# Patient Record
Sex: Female | Born: 1995 | Race: White | Hispanic: No | Marital: Single | Smoking: Never smoker
Health system: Southern US, Community
[De-identification: ages and names within clinical notes are randomized; demographics above are authoritative.]

---
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
## 2017-09-13 ENCOUNTER — Encounter (HOSPITAL_COMMUNITY): Payer: Self-pay | Admitting: *Deleted

## 2017-09-13 ENCOUNTER — Emergency Department (HOSPITAL_COMMUNITY)
Admission: EM | Admit: 2017-09-13 | Discharge: 2017-09-13 | Disposition: A | Discharge status: Home / Self Care | Payer: BC Managed Care – PPO | Attending: Emergency Medicine | Admitting: Emergency Medicine

## 2017-09-13 ENCOUNTER — Emergency Department (HOSPITAL_COMMUNITY): Payer: BC Managed Care – PPO

## 2017-09-13 DIAGNOSIS — M545 Low back pain: Secondary | ICD-10-CM | POA: Diagnosis present

## 2017-09-13 LAB — POC URINE PREG, ED: Preg Test, Ur: NEGATIVE

## 2017-09-13 MED ORDER — KETOROLAC TROMETHAMINE 30 MG/ML IJ SOLN
30.0000 mg | Freq: Once | INTRAMUSCULAR | Status: AC
  Administered 2017-09-13: 30 mg via INTRAMUSCULAR
  Filled 2017-09-13: qty 1

## 2017-09-13 MED ORDER — HYDROCODONE-ACETAMINOPHEN 5-325 MG PO TABS
1.0000 | ORAL_TABLET | Freq: Once | ORAL | Status: AC
  Administered 2017-09-13: 1 via ORAL
  Filled 2017-09-13: qty 1

## 2017-09-13 NOTE — ED Notes (Signed)
Called MRI.  They will most likely be ready for pt at 1700.  Family notified.

## 2017-09-13 NOTE — ED Triage Notes (Signed)
Pt here via [facility] after sustaining back injury while kicking a soccer ball.  Pt began experiencing central back pain while kicking, pain increased when landing and with walking.  Initial numbness to L leg and R hip pain.

## 2017-09-13 NOTE — ED Notes (Signed)
Patient transported to CT 

## 2017-09-13 NOTE — ED Provider Notes (Signed)
Assumed care from [facility], at 1500.  Patient presented with lumbar pain, numbness, and tingling in bilateral lower extremities after a soccer injury today.  History significant for a similar injury approximately 2 months ago, at which time she had a lumbar x-ray that was normal.  Analgesics administered prior to my assumption of care: toradol and hydrocodone.  Plan at the time of handoff:  - Follow-up PVR - Follow-up MRI lumbar spine  5:47 PM At the time of my assessment, the patient was neurovascularly intact.  PVR negative and MRI negative.  The patient was able to ambulate without assistance throughout the hospital room and did not demonstrate any weakness or sensory deficits on my exam.  I discussed the above results with the patient who verbalized understanding.  Return precautions and follow-up plans discussed with the patient and her parents including the importance of establishing a PCP for follow-up.  The patient was discharged in stable condition and encouraged to treat her pain with Tylenol and/or Motrin as needed.    Levester Fresh, MD 09/14/17 1610    Blane Ohara, MD 09/14/17 [phone]

## 2017-09-13 NOTE — ED Notes (Signed)
Patient transported to MRI 

## 2017-09-13 NOTE — ED Provider Notes (Signed)
[facility]-EMERGENCY DEPT Provider Note   CSN: [id number] Arrival date & time: 09/13/17  1425     History   Chief Complaint Chief Complaint  Patient presents with  . Back Pain    HPI Kariah Loredo is a 21 y.o. female.  HPI 21 year old Caucasian female with no significant past medical history presents to the emergency department today with complaints of low back pain after playing soccer. The patient states that she was running today and went to kick the ball when she experienced acute onset sudden pain in her lower back. Patient states the pain radiated down both legs and dropped her to her knees. Patient tried to get up and walk to the pain was too severe and had a bee transported by EMS to the ED for evaluation. Patient reports some mild numbness to the left leg and right hip. Pt has same injury 2 months ago and prior injury. He was seen in the ER and [place] with x-rays that were normal. Patient states at that time she did have some loss of sensation in her lower extremities that self resolved. Patient has not had follow up. Patient denies any loss of bowel or bladder. Currently denies any parathesias. Denies any hx of iv drug use or cancer. Pt states pain is worse with movement. Holding still makes the pain better.patient not taking anything for the pain prior to arrival. Denies head injury or loc.   Pt denies any fever, chill, ha, vision changes, lightheadedness, dizziness, congestion, neck pain, cp, sob, cough, abd pain, n/v/d, urinary symptoms, change in bowel habits, melena, hematochezia.  History reviewed. No pertinent past medical history.  There are no active problems to display for this patient.   History reviewed. No pertinent surgical history.  OB History    No data available       Home Medications    Prior to Admission medications   Not on File    Family History No family history on file.  Social History Social History  Substance Use Topics  . Smoking  status: Never Smoker  . Smokeless tobacco: Never Used  . Alcohol use No     Allergies   Patient has no known allergies.   Review of Systems Review of Systems  Constitutional: Negative for chills and fever.  HENT: Negative for congestion.   Eyes: Negative for visual disturbance.  Respiratory: Negative for cough and shortness of breath.   Cardiovascular: Negative for chest pain.  Gastrointestinal: Negative for abdominal pain, diarrhea, nausea and vomiting.  Genitourinary: Negative for dysuria, flank pain, frequency, hematuria, urgency, vaginal bleeding and vaginal discharge.  Musculoskeletal: Positive for back pain. Negative for arthralgias and myalgias.  Skin: Negative for rash.  Neurological: Positive for numbness. Negative for dizziness, syncope, weakness, light-headedness and headaches.  Psychiatric/Behavioral: Negative for sleep disturbance. The patient is not nervous/anxious.      Physical Exam Updated Vital Signs Temp 98.7 F (37.1 C) (Oral)   Ht  (1.676 m)   Wt 59 kg (130 lb)   LMP 08/13/2017   BMI 20.98 kg/m   Physical Exam  Constitutional: She is oriented to person, place, and time. She appears well-developed and well-nourished.  Non-toxic appearance. No distress.  HENT:  Head: Normocephalic and atraumatic.  Nose: Nose normal.  Mouth/Throat: Oropharynx is clear and moist.  Eyes: Pupils are equal, round, and reactive to light. Conjunctivae are normal. Right eye exhibits no discharge. Left eye exhibits no discharge.  Neck: Normal range of motion. Neck supple.  Cardiovascular: Normal  rate, regular rhythm, normal heart sounds and intact distal pulses.   DP pulses are 2+ bilaterally.  Pulmonary/Chest: Effort normal and breath sounds normal. No respiratory distress. She exhibits no tenderness.  Abdominal: Soft. Bowel sounds are normal. There is no tenderness. There is no rebound and no guarding.  Musculoskeletal: Normal range of motion. She exhibits no  tenderness.  No midline T spine tenderness. No deformities or step offs noted. Full ROM. Pelvis is stable.   L spine tenderness midline. No obvious deformities or step offs.   Range of motion of the flexion and extension of bilateral knees, ankles, hips.  Lymphadenopathy:    She has no cervical adenopathy.  Neurological: She is alert and oriented to person, place, and time.  The patient is alert, attentive, and oriented x 3. Speech is clear. Cranial nerve II-VII grossly intact. Negative pronator drift. Sensation intact to sharp/dull. Strength 5/5 in all extremities. Reflexes 2+ and symmetric at biceps, triceps, knees, and ankles. Rapid alternating movement and fine finger movements intact. No clonus.  Negative straight leg raise test.    Skin: Skin is warm and dry. Capillary refill takes less than 2 seconds.  Psychiatric: Her behavior is normal. Judgment and thought content normal.  Nursing note and vitals reviewed.    ED Treatments / Results  Labs (all labs ordered are listed, but only abnormal results are displayed) Labs Reviewed  POC URINE PREG, ED    EKG  EKG Interpretation None       Radiology No results found.  Procedures Procedures (including critical care time)  Medications Ordered in ED Medications  HYDROcodone-acetaminophen (NORCO/VICODIN) 5-325 MG per tablet 1 tablet (not administered)     Initial Impression / Assessment and Plan / ED Course  I have reviewed the triage vital signs and the nursing notes.  Pertinent labs & imaging results that were available during my care of the patient were reviewed by me and considered in my medical decision making (see chart for details).     Patient presents to the ED with complaints of low back pain following an injury playing sports today. History of same. Patient reports some mild paresthesias initially that has since resolved. Has not taken anything for the pain prior to arrival. Has not been ambulatory since the  event due to the pain.  Vital signs are reassuring. On exam patient has no focal neuro deficits. Normal strength in  Extremities. Sensation intact. Neurovascularly intact. Patient has no loss of bowel or bladder. No urinary retention. Low suspicion for cauda equina. We will perform x-ray and give pain medicine. WE WILL REEVALUATE PATIENT"S PAIN AND ABILITY TO AMBULATE FOLLOWING PAIN MEDICINES AND X_RAY. This will determine if further imaging is indicated.  Care hand off to resident and Dr. Jodi Mourning for reevaluation after x-ray and pain medicine and further disposition. Patient remains hemodynamically stable th time.  Final Clinical Impressions(s) / ED Diagnoses   Final diagnoses:  None    New Prescriptions New Prescriptions   No medications on file     Wallace Keller 09/13/17 1523    Blane Ohara, MD 09/14/17 [phone]

## 2017-09-13 NOTE — Discharge Instructions (Signed)
Take Motrin and/or Tylenol as needed for pain and use ice/heat packs.  Do not play soccer until your symptoms have resolved.  Follow-up with your primary care physician within 3-5 days for additional evaluation if symptoms persist.

## 2017-09-13 NOTE — ED Notes (Signed)
Pt ambulated into the hallway and back to her bed with minimal assistance.  Pt tolerated well and denied any increase in weakness, dizziness, or pain.

## 2018-03-23 IMAGING — MR MR LUMBAR SPINE W/O CM
4 of 5 series · 19 of 48 positions shown · non-contrast
Comparison: None.

CLINICAL DATA: Initial evaluation for acute low back pain, numbness
in left leg and right hip.

EXAM:
MRI LUMBAR SPINE WITHOUT CONTRAST
TECHNIQUE: Multiplanar, multisequence MR imaging of the lumbar spine was
performed. No intravenous contrast was administered.

[Series 4: T2 · sagittal · 4.0mm · 0.55mm/px · 6 of 13 slices shown (1 of 2)]
[im 1/13]
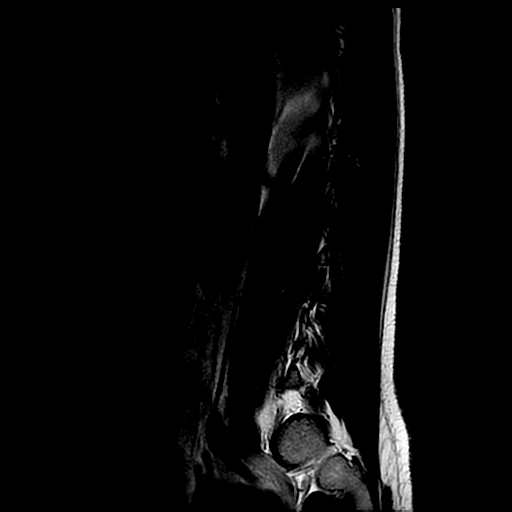
[im 3/13]
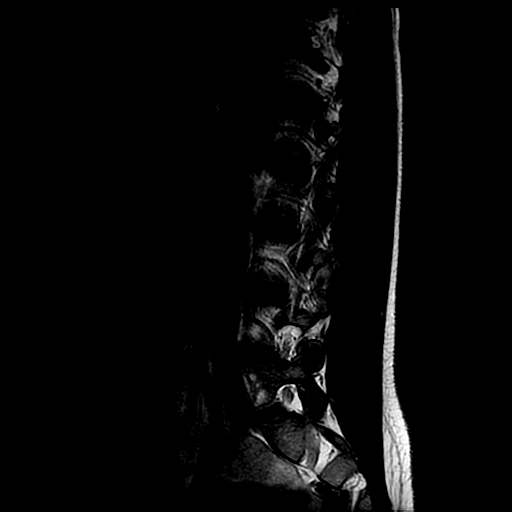
[im 5/13]
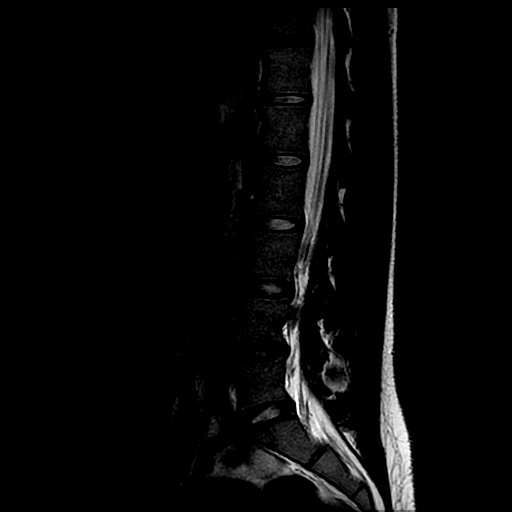
[im 8/13]
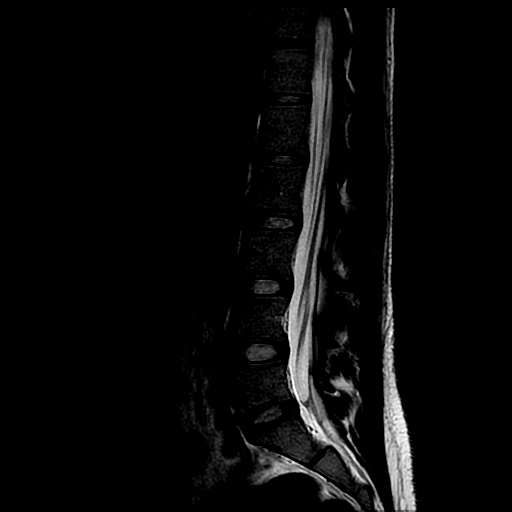
[im 10/13]
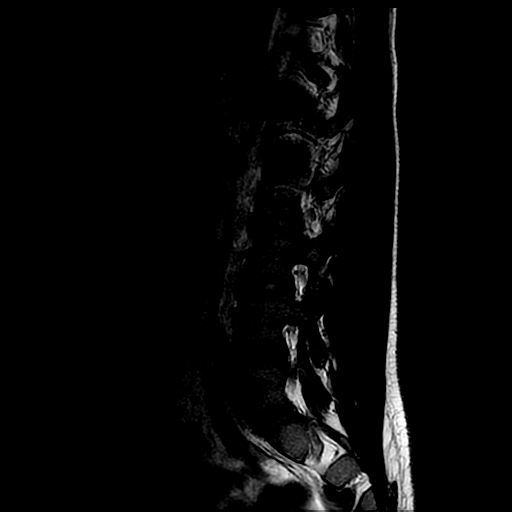
[im 13/13]
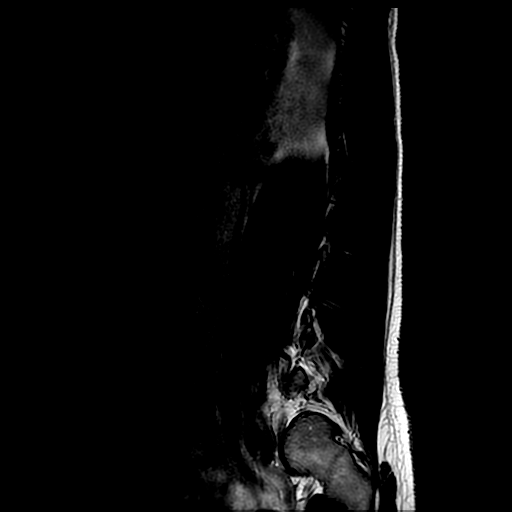

[Series 6: T1 · sagittal · 4.0mm · 0.55mm/px · 3 of 13 slices shown (1 of 2)]
[im 3/13]
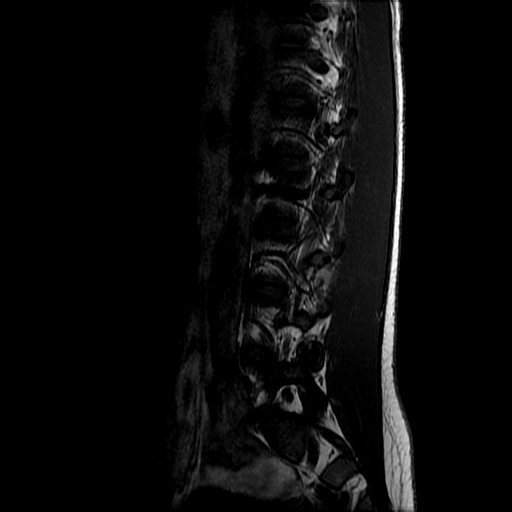
[im 8/13]
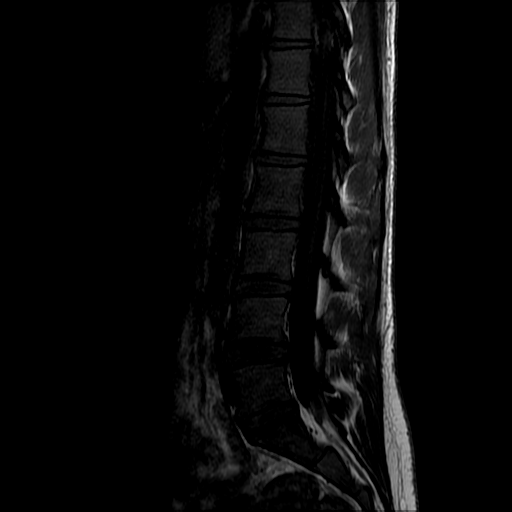
[im 13/13]
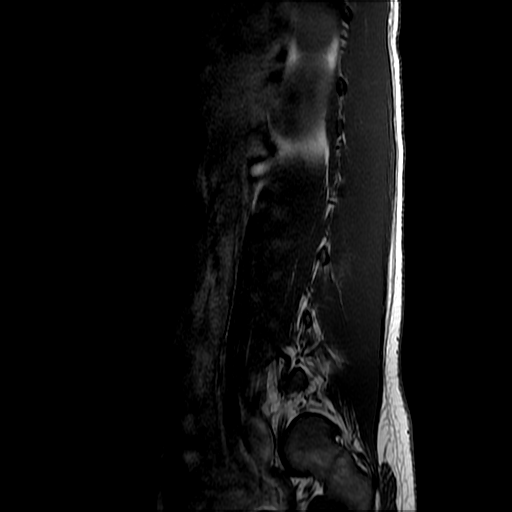

[Series 7: T2 · axial · 4.0mm · 0.39mm/px · z∈[-176,-16]mm · 7 of 35 slices shown (2 of 2)]
[im 1/35]
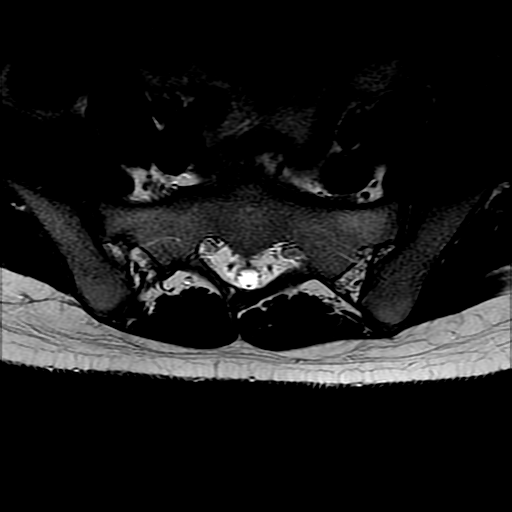
[im 5/35]
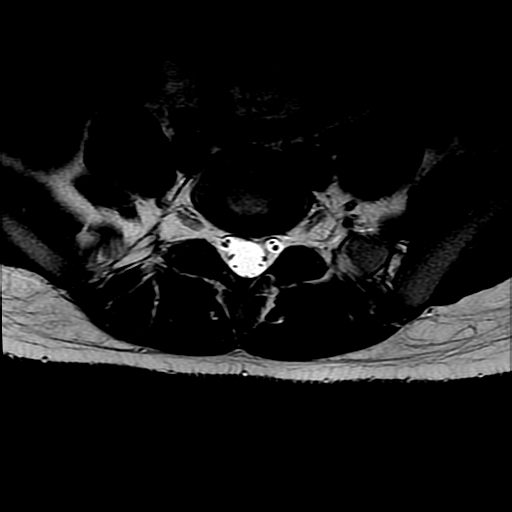
[im 10/35]
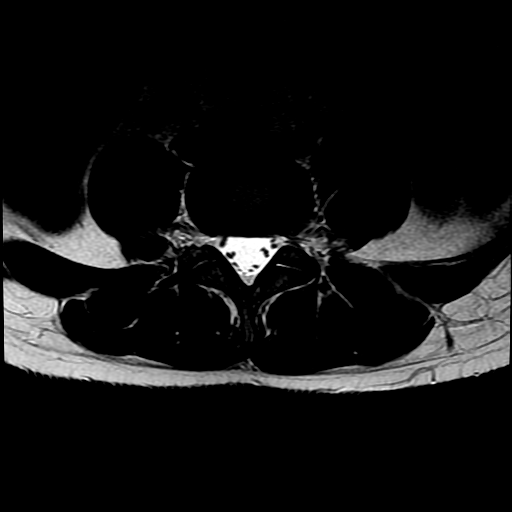
[im 15/35]
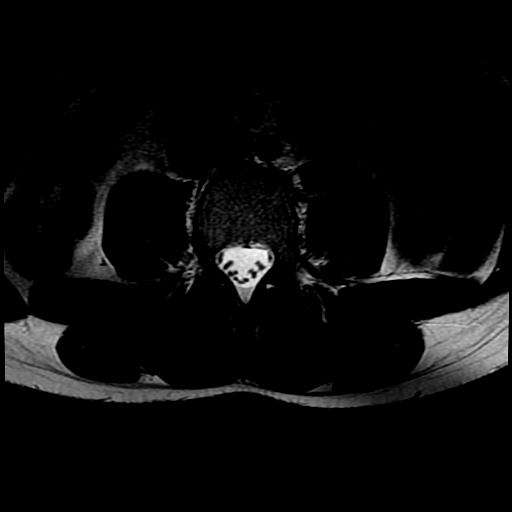
[im 18/35]
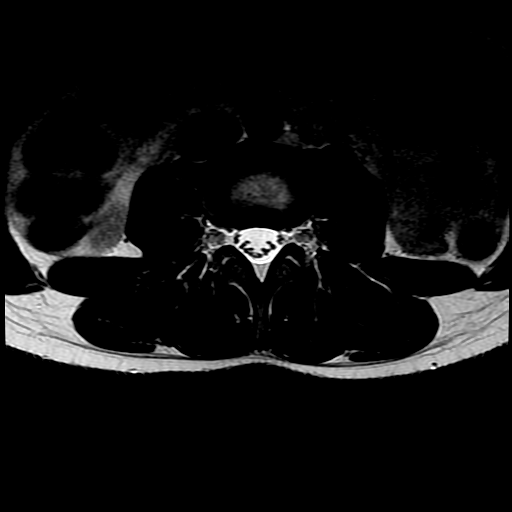
[im 20/35]
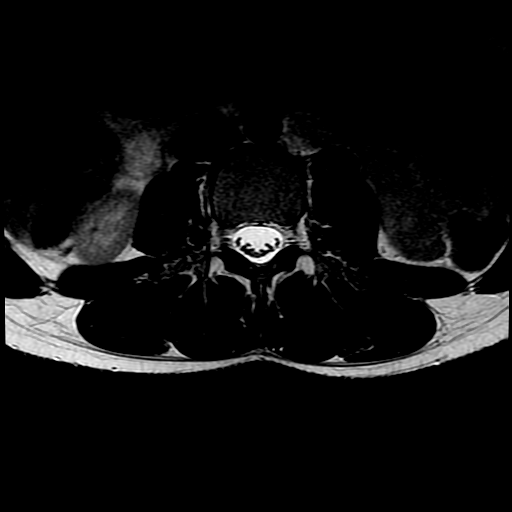
[im 30/35]
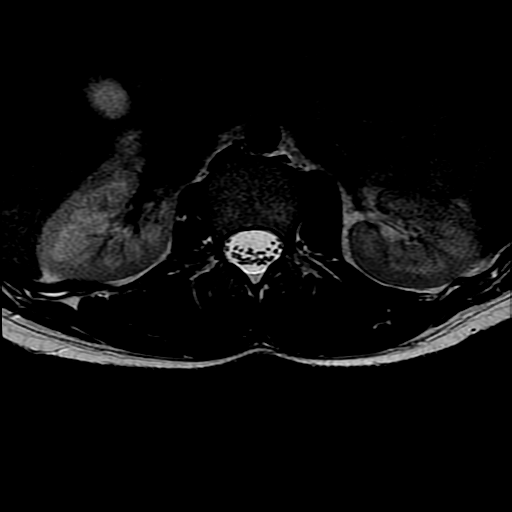

[Series 8: T1 · axial · 4.0mm · 0.39mm/px · z∈[-156,-16]mm · 3 of 35 slices shown (2 of 2)]
[im 5/35]
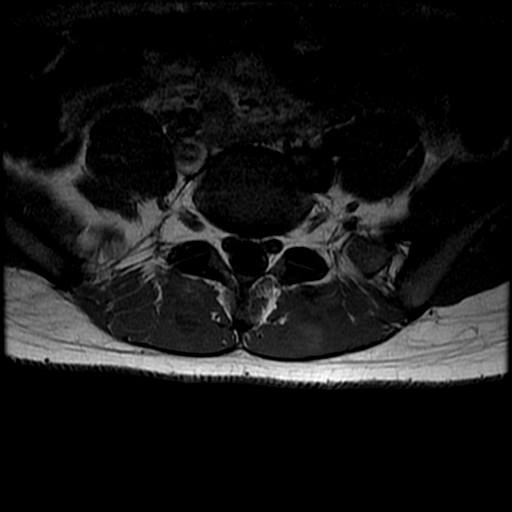
[im 18/35]
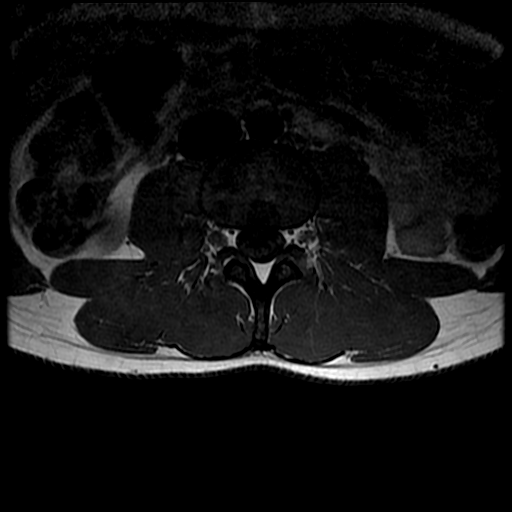
[im 30/35]
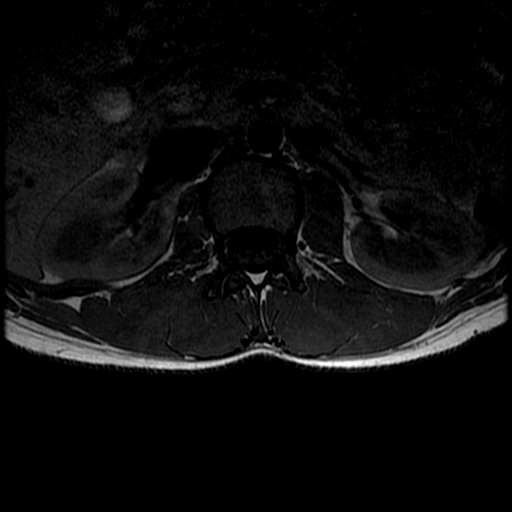

[19 of 48 positions shown; findings below may reference images not displayed]

FINDINGS: Segmentation: Normal segmentation. Lowest well-formed disc labeled
the L5-S1 level.

Alignment: Vertebral bodies normally aligned with preservation of
the normal lumbar lordosis. No listhesis.

Vertebrae: Vertebral body heights well maintained. No evidence for
acute or chronic fracture. Signal intensity within the vertebral
body bone marrow diffusely decreased on T1 weighted imaging, most
commonly related to anemia, smoking, or obesity. No discrete or
worrisome osseous lesions. No abnormal marrow edema.

Conus medullaris: Extends to the T12 level and appears normal.

Paraspinal and other soft tissues: Paraspinous soft tissues within
normal limits. Visualized visceral structures are normal.

Disc levels:

No disc pathology seen within the lumbar spine. Intervertebral discs
are well hydrated. No disc bulge or disc protrusion. No significant
facet disease. No canal or neural foraminal stenosis. No evidence
for neural impingement.
IMPRESSION: 1. No acute abnormality within the lumbar spine.
2. Diffusely decreased T1 weighted signal intensity within the
visualized bone marrow. Finding is nonspecific, but most commonly
related to anemia, smoking, obesity, or other marrow infiltrative
process. Correlation with laboratory values suggested.
3. Otherwise normal MRI of the lumbar spine. No disc pathology. No
significant canal or foraminal stenosis. No evidence for neural
impingement.
